# Patient Record
Sex: Male | Born: 1970 | Race: White | Hispanic: No | Marital: Married | Smoking: Never smoker
Health system: Southern US, Community
[De-identification: ages and names within clinical notes are randomized; demographics above are authoritative.]

## PROBLEM LIST (undated history)

## (undated) DIAGNOSIS — E785 Hyperlipidemia, unspecified: Secondary | ICD-10-CM

## (undated) DIAGNOSIS — I2510 Atherosclerotic heart disease of native coronary artery without angina pectoris: Secondary | ICD-10-CM

## (undated) HISTORY — PX: FRACTURE SURGERY: SHX138

## (undated) HISTORY — DX: Atherosclerotic heart disease of native coronary artery without angina pectoris: I25.10

## (undated) HISTORY — DX: Hyperlipidemia, unspecified: E78.5

---

## 1997-11-10 ENCOUNTER — Emergency Department (HOSPITAL_COMMUNITY): Admission: EM | Admit: 1997-11-10 | Discharge: 1997-11-10 | Payer: Self-pay | Admitting: Emergency Medicine

## 1998-09-27 ENCOUNTER — Ambulatory Visit (HOSPITAL_COMMUNITY): Admission: RE | Admit: 1998-09-27 | Discharge: 1998-09-27 | Payer: Self-pay | Admitting: Internal Medicine

## 1999-05-02 ENCOUNTER — Emergency Department (HOSPITAL_COMMUNITY): Admission: EM | Admit: 1999-05-02 | Discharge: 1999-05-02 | Payer: Self-pay | Admitting: Emergency Medicine

## 2000-03-28 ENCOUNTER — Ambulatory Visit (HOSPITAL_COMMUNITY): Admission: RE | Admit: 2000-03-28 | Discharge: 2000-03-28 | Payer: Self-pay | Admitting: Internal Medicine

## 2000-03-28 ENCOUNTER — Encounter: Payer: Self-pay | Admitting: Internal Medicine

## 2002-04-10 ENCOUNTER — Encounter: Payer: Self-pay | Admitting: *Deleted

## 2002-04-10 ENCOUNTER — Emergency Department (HOSPITAL_COMMUNITY): Admission: EM | Admit: 2002-04-10 | Discharge: 2002-04-10 | Payer: Self-pay | Admitting: *Deleted

## 2002-04-19 ENCOUNTER — Emergency Department (HOSPITAL_COMMUNITY): Admission: EM | Admit: 2002-04-19 | Discharge: 2002-04-20 | Payer: Self-pay | Admitting: Emergency Medicine

## 2002-04-20 ENCOUNTER — Encounter: Payer: Self-pay | Admitting: Emergency Medicine

## 2002-04-26 ENCOUNTER — Ambulatory Visit (HOSPITAL_COMMUNITY): Admission: RE | Admit: 2002-04-26 | Discharge: 2002-04-26 | Payer: Self-pay | Admitting: Orthopedic Surgery

## 2002-04-26 ENCOUNTER — Encounter: Payer: Self-pay | Admitting: Orthopedic Surgery

## 2002-05-04 ENCOUNTER — Ambulatory Visit (HOSPITAL_BASED_OUTPATIENT_CLINIC_OR_DEPARTMENT_OTHER): Admission: RE | Admit: 2002-05-04 | Discharge: 2002-05-05 | Payer: Self-pay | Admitting: Orthopedic Surgery

## 2002-05-16 ENCOUNTER — Encounter: Payer: Self-pay | Admitting: Emergency Medicine

## 2002-05-16 ENCOUNTER — Emergency Department (HOSPITAL_COMMUNITY): Admission: EM | Admit: 2002-05-16 | Discharge: 2002-05-16 | Payer: Self-pay | Admitting: Emergency Medicine

## 2002-05-24 ENCOUNTER — Encounter: Admission: RE | Admit: 2002-05-24 | Discharge: 2002-08-22 | Payer: Self-pay | Admitting: Orthopedic Surgery

## 2008-04-28 ENCOUNTER — Emergency Department (HOSPITAL_COMMUNITY): Admission: EM | Admit: 2008-04-28 | Discharge: 2008-04-28 | Payer: Self-pay | Admitting: Emergency Medicine

## 2008-04-30 ENCOUNTER — Emergency Department (HOSPITAL_BASED_OUTPATIENT_CLINIC_OR_DEPARTMENT_OTHER): Admission: EM | Admit: 2008-04-30 | Discharge: 2008-04-30 | Payer: Self-pay | Admitting: Emergency Medicine

## 2008-05-06 ENCOUNTER — Emergency Department (HOSPITAL_COMMUNITY): Admission: EM | Admit: 2008-05-06 | Discharge: 2008-05-06 | Payer: Self-pay | Admitting: Emergency Medicine

## 2008-05-10 ENCOUNTER — Encounter: Admission: RE | Admit: 2008-05-10 | Discharge: 2008-05-10 | Payer: Self-pay | Admitting: Orthopedic Surgery

## 2008-05-11 ENCOUNTER — Encounter: Admission: RE | Admit: 2008-05-11 | Discharge: 2008-05-11 | Payer: Self-pay | Admitting: Orthopedic Surgery

## 2009-07-06 ENCOUNTER — Emergency Department (HOSPITAL_COMMUNITY): Admission: EM | Admit: 2009-07-06 | Discharge: 2009-07-06 | Payer: Self-pay | Admitting: Emergency Medicine

## 2009-07-11 ENCOUNTER — Encounter: Admission: RE | Admit: 2009-07-11 | Discharge: 2009-07-11 | Payer: Self-pay | Admitting: Family Medicine

## 2010-05-06 ENCOUNTER — Encounter: Payer: Self-pay | Admitting: Family Medicine

## 2010-06-08 ENCOUNTER — Other Ambulatory Visit: Payer: Self-pay | Admitting: Family Medicine

## 2010-06-08 DIAGNOSIS — R911 Solitary pulmonary nodule: Secondary | ICD-10-CM

## 2010-06-15 ENCOUNTER — Ambulatory Visit
Admission: RE | Admit: 2010-06-15 | Discharge: 2010-06-15 | Disposition: A | Discharge status: Home / Self Care | Payer: Managed Care, Other (non HMO) | Source: Ambulatory Visit | Attending: Family Medicine | Admitting: Family Medicine

## 2010-06-15 DIAGNOSIS — R911 Solitary pulmonary nodule: Secondary | ICD-10-CM

## 2010-06-15 MED ORDER — IOHEXOL 300 MG/ML  SOLN
75.0000 mL | Freq: Once | INTRAMUSCULAR | Status: AC | PRN
  Administered 2010-06-15: 75 mL via INTRAVENOUS

## 2010-07-09 LAB — CBC
HCT: 37.3 % — ABNORMAL LOW (ref 39.0–52.0)
MCV: 90.7 fL (ref 78.0–100.0)
Platelets: 215 10*3/uL (ref 150–400)
WBC: 6.5 10*3/uL (ref 4.0–10.5)

## 2010-07-09 LAB — COMPREHENSIVE METABOLIC PANEL
ALT: 19 U/L (ref 0–53)
AST: 20 U/L (ref 0–37)
Alkaline Phosphatase: 53 U/L (ref 39–117)
Creatinine, Ser: 1.09 mg/dL (ref 0.4–1.5)
GFR calc Af Amer: >60 mL/min (ref >60)
Total Bilirubin: 0.7 mg/dL (ref 0.3–1.2)
Total Protein: 6.6 g/dL (ref 6.0–8.3)

## 2010-07-09 LAB — POCT CARDIAC MARKERS
CKMB, poc: <1 ng/mL — ABNORMAL LOW (ref 1.0–8.0)
CKMB, poc: <1 ng/mL — ABNORMAL LOW (ref 1.0–8.0)
Troponin i, poc: <0.05 ng/mL (ref 0.00–0.09)
Troponin i, poc: <0.05 ng/mL (ref 0.00–0.09)

## 2010-07-09 LAB — DIFFERENTIAL
Eosinophils Relative: 1 % (ref 0–5)
Monocytes Relative: 9 % (ref 3–12)
Neutro Abs: 4.2 10*3/uL (ref 1.7–7.7)

## 2010-08-31 NOTE — Op Note (Signed)
NAME:  Frank Guzman, Frank Guzman                          ACCOUNT NO.:  [id number]   MEDICAL RECORD NO.:  [id number]                   PATIENT TYPE:  AMB   LOCATION:  DSC                                  FACILITY:  [facility]   PHYSICIAN:  Leonides Grills, M.D.                  DATE OF BIRTH:  1971/01/12   DATE OF PROCEDURE:  05/04/2002  DATE OF DISCHARGE:                                 OPERATIVE REPORT   PREOPERATIVE DIAGNOSIS:  Right closed calcaneus fracture.   POSTOPERATIVE DIAGNOSES:  1. Right closed calcaneus fracture.  2. Peroneus longus tendon tear.   PROCEDURES:  1. Open reduction and internal fixation of right calcaneus fracture.  2. Repair of peroneus longus tendon tear.   ANESTHESIA:  General endotracheal tube.   SURGEON:  Leonides Grills, M.D.   ASSISTANT:  Lianne Cure, P.A.   ESTIMATED BLOOD LOSS:  Minimal.   TOURNIQUET TIME:  Two hours.   COMPLICATIONS:  None.   DISPOSITION:  Stable to the PAR.   INDICATIONS:  This is a 40 year old male who fell 25 feet off a ladder and  sustained an isolated right calcaneus fracture.  After adequate x-rays and  CT scan were obtained and elevation over the last two weeks, he was brought  to the operating room for open reduction and internal fixation.  He was  consented for the above procedure.  All risks, which include infection,  nerve or vessel injury, flap breakdown, arthritis, stiffness, possible  fusion, osteomyelitis, were all explained, and questions were encouraged and  answered.   DESCRIPTION OF PROCEDURE:  The patient was brought to the operating room and  placed in the supine position after adequate general endotracheal tube  anesthesia was administered as well as vancomycin 1 g IV piggyback.  The  patient was then placed in the lateral decubitus position with the operative  side up.  An axillary roll was placed.  All bony prominences were well-  padded.  A table-type configuration was then made using blankets and the  limb was then gravity-exsanguinated and the tourniquet was elevated to 290  mmHg after this was prepped and draped in a sterile manner over a proximally-  placed thigh tourniquet.  An L-shaped extensile exposure to the lateral wall  of the calcaneus was then made.  Dissection was carried down through skin.  The sural nerve was identified in both the proximal and distal aspects of  the wound.  The flap was then elevated off the calcaneus subperiosteally and  two 2 mm K-wires were then placed in the lateral edge of the talus and a  flap was not touched or retracted for the remaining portion of the  procedure.  The peroneus longus tendon was identified and a jagged edge of  the lateral wall was protruding and caused a tear in the longus tendon.  This was then repaired with 6-0 nylon suture in a cannulated  manner.  Once  this was repaired, the lateral wall was trimmed back to prevent any  irritation of the tendon.  We then removed the lateral wall of the  calcaneus, which included a portion of the posterior facet.  Once this was  removed, the remaining portion of posterior facet was evaluated.  There was  a small strip approximately 2 mm that was free and did not have any  significant cancellous bone to repair to.  This was removed from the joint.  The medial wall on CT scan was not displaced and the hindfoot was not in  varus or valgus, it was anatomic, in 5 degrees of valgus.  It did not need a  reduction with a Shands pin.  However, the posterior facet medially was  depressed and needed to be reduced adequately.  With this held reduced, K-  wires were then fired axially and held the posterior facet in the reduced  position.  Once this was done, we then  placed the remaining portion of the  lateral wall with the remaining portion of the posterior facet and held this  reduced against the medial portion of posterior facet.  This was then keyed  in at the angle of Gissane and held again with  K-wires provisionally.  We  then obtained C-arm views to show that this was adequately reduced.  We then  obtained a Y plate, i.e., Letournel plate, cut it, pre-bent it to the  lateral wall of the calcaneus.  Multiple 3.5 mm fully-threaded cortical lag  screws were then placed, which sucked the lateral wall in and reduced the  overall width of the calcaneus.  This was then verified under C-arm guidance  to be anatomically reduced with the adequate length of screws.  All screws  surprisingly had excellent purchase as well.  The wound was copiously  irrigated with normal saline.  We did not need to put any allograft chips in  the area of the calcaneus.  Once the wound was copiously irrigated with  normal saline, the tourniquet was deflated.  There was minimal bleeding and  did not require a drain.  We then closed the deep subcu with 2-0 Vicryl, the  skin was closed with 4-0 nylon Allgower-Danoti technique stitch.  A sterile  dressing was applied.  A modified Jones dressing was applied with the ankle  in neutral dorsiflexion.  The patient was stable to the PAR.                                               Leonides Grills, M.D.    PB/MEDQ  D:  05/04/2002  T:  05/04/2002  Job:  [id number]

## 2017-05-04 ENCOUNTER — Emergency Department (HOSPITAL_COMMUNITY): Payer: 59

## 2017-05-04 ENCOUNTER — Encounter (HOSPITAL_COMMUNITY): Payer: Self-pay | Admitting: *Deleted

## 2017-05-04 ENCOUNTER — Other Ambulatory Visit: Payer: Self-pay

## 2017-05-04 ENCOUNTER — Emergency Department (HOSPITAL_COMMUNITY)
Admission: EM | Admit: 2017-05-04 | Discharge: 2017-05-04 | Disposition: A | Discharge status: Home / Self Care | Payer: 59 | Attending: Emergency Medicine | Admitting: Emergency Medicine

## 2017-05-04 DIAGNOSIS — Y9389 Activity, other specified: Secondary | ICD-10-CM | POA: Diagnosis not present

## 2017-05-04 DIAGNOSIS — Y998 Other external cause status: Secondary | ICD-10-CM | POA: Diagnosis not present

## 2017-05-04 DIAGNOSIS — R42 Dizziness and giddiness: Secondary | ICD-10-CM | POA: Insufficient documentation

## 2017-05-04 DIAGNOSIS — S6992XA Unspecified injury of left wrist, hand and finger(s), initial encounter: Secondary | ICD-10-CM | POA: Diagnosis present

## 2017-05-04 DIAGNOSIS — S62115A Nondisplaced fracture of triquetrum [cuneiform] bone, left wrist, initial encounter for closed fracture: Secondary | ICD-10-CM | POA: Insufficient documentation

## 2017-05-04 DIAGNOSIS — R110 Nausea: Secondary | ICD-10-CM | POA: Insufficient documentation

## 2017-05-04 DIAGNOSIS — Z880 Allergy status to penicillin: Secondary | ICD-10-CM | POA: Insufficient documentation

## 2017-05-04 DIAGNOSIS — Y929 Unspecified place or not applicable: Secondary | ICD-10-CM | POA: Diagnosis not present

## 2017-05-04 DIAGNOSIS — R61 Generalized hyperhidrosis: Secondary | ICD-10-CM | POA: Insufficient documentation

## 2017-05-04 DIAGNOSIS — W010XXA Fall on same level from slipping, tripping and stumbling without subsequent striking against object, initial encounter: Secondary | ICD-10-CM | POA: Diagnosis not present

## 2017-05-04 MED ORDER — OXYCODONE-ACETAMINOPHEN 5-325 MG PO TABS
1.0000 | ORAL_TABLET | ORAL | Status: DC | PRN
  Administered 2017-05-04: 1 via ORAL
  Filled 2017-05-04 (×2): qty 1

## 2017-05-04 MED ORDER — OXYCODONE-ACETAMINOPHEN 5-325 MG PO TABS: 2.0000 | ORAL_TABLET | ORAL | 0 refills | Status: DC | PRN

## 2017-05-04 MED ORDER — ONDANSETRON 4 MG PO TBDP: 4.0000 mg | ORAL_TABLET | Freq: Three times a day (TID) | ORAL | 0 refills | Status: DC | PRN

## 2017-05-04 NOTE — ED Triage Notes (Signed)
Pt is here with left wrist injury after falling back off a Hooverboard.  Ice pack placed to site

## 2017-05-04 NOTE — ED Notes (Signed)
Splint applied. Instructions reviewed. Patient and family good for home.

## 2017-05-04 NOTE — Discharge Instructions (Signed)
Apply ice for 20 minutes 3-4 times per day. Leave splint in place until seen by orthopedics. Call Dr. Eulah PontMurphy for outpatient appointment for reevaluation and casting

## 2017-05-04 NOTE — ED Notes (Signed)
Ortho paged and returned called

## 2017-05-04 NOTE — ED Provider Notes (Signed)
[facility] Provider Note   CSN: [id number] Arrival date & time: 05/04/17  1507     History   Chief Complaint Chief Complaint  Patient presents with  . Wrist Injury    HPI Glenda ChromanWayne C Carmer is a 47 y.o. male.  Chief complaint is wrist injury.  HPI: 47 year old male.  States he was going to show his son how to use a however board.  He has never used a however board before.  He fell backwards onto an outstretched left wrist and felt immediate pain.  He states he grabbed his wrist and tried to move it.  He felt the "bone move".  He became nauseated near syncopal.  He has a history of vagal episodes.  History reviewed. No pertinent past medical history.  There are no active problems to display for this patient.   Past Surgical History:  Procedure Laterality Date  . FRACTURE SURGERY         Home Medications    Prior to Admission medications   Medication Sig Start Date End Date Taking? Authorizing Provider  ondansetron (ZOFRAN ODT) 4 MG disintegrating tablet Take 1 tablet (4 mg total) by mouth every 8 (eight) hours as needed for nausea. 05/04/17   Rolland PorterJames, Eleuterio Dollar, MD  oxyCODONE-acetaminophen (PERCOCET/ROXICET) 5-325 MG tablet Take 2 tablets by mouth every 4 (four) hours as needed. 05/04/17   Rolland PorterJames, Jaelyne Deeg, MD    Family History No family history on file.  Social History Social History   Tobacco Use  . Smoking status: Never Smoker  . Smokeless tobacco: Never Used  Substance Use Topics  . Alcohol use: Yes    Frequency: Never  . Drug use: No     Allergies   Penicillins and Tetanus toxoids   Review of Systems Review of Systems  Constitutional: Positive for diaphoresis. Negative for appetite change, chills, fatigue and fever.  HENT: Negative for mouth sores, sore throat and trouble swallowing.   Eyes: Negative for visual disturbance.  Respiratory: Negative for cough, chest tightness, shortness of breath and wheezing.     Cardiovascular: Negative for chest pain.  Gastrointestinal: Positive for nausea. Negative for abdominal distention, abdominal pain, diarrhea and vomiting.  Endocrine: Negative for polydipsia, polyphagia and polyuria.  Genitourinary: Negative for dysuria, frequency and hematuria.  Musculoskeletal: Negative for gait problem.       Left wrist pain.  Skin: Negative for color change, pallor and rash.  Neurological: Positive for dizziness. Negative for syncope, light-headedness and headaches.  Hematological: Does not bruise/bleed easily.  Psychiatric/Behavioral: Negative for behavioral problems and confusion.     Physical Exam Updated Vital Signs BP (!) 148/83 (BP Location: Right Arm)   Pulse 74   Temp 98.2 F (36.8 C) (Oral)   Resp 18   SpO2 100%   Physical Exam  Constitutional: He is oriented to person, place, and time. He appears well-developed and well-nourished. No distress.  HENT:  Head: Normocephalic.  Eyes: No scleral icterus.  Neck: No thyromegaly present.  Cardiovascular: Normal rate and regular rhythm. Exam reveals no gallop and no friction rub.  No murmur heard. Pulmonary/Chest: Effort normal and breath sounds normal. No respiratory distress. He has no wheezes. He has no rales.  Abdominal: He exhibits no distension. There is no tenderness. There is no rebound.  Musculoskeletal:  Tenderness to the dorsal medial wrist in the area of the triquetrum.  Proximal to the fourth and fifth ray  Neurological: He is alert and oriented to person, place, and time.  Skin: Skin is warm and dry. No rash noted.  Psychiatric: He has a normal mood and affect. His behavior is normal.     ED Treatments / Results  Labs (all labs ordered are listed, but only abnormal results are displayed) Labs Reviewed - No data to display  EKG  EKG Interpretation None       Radiology Dg Wrist Complete Left  Result Date: 05/04/2017 CLINICAL DATA:  Pain after fall EXAM: LEFT WRIST - COMPLETE 3+  VIEW COMPARISON:  None. FINDINGS: There is a fracture fragment identified along the dorsum of the wrist on the lateral view only. No other suspicious findings. A bone cyst is seen in the scaphoid. No scaphoid fractures noted. IMPRESSION: A fracture is identified on the lateral view only. I suspect the triquetrum is involved. Electronically Signed   By: Gerome Sam III M.D   On: 05/04/2017 15:53    Procedures Procedures (including critical care time)  Medications Ordered in ED Medications  oxyCODONE-acetaminophen (PERCOCET/ROXICET) 5-325 MG per tablet 1 tablet (1 tablet Oral Given 05/04/17 1520)     Initial Impression / Assessment and Plan / ED Course  I have reviewed the triage vital signs and the nursing notes.  Pertinent labs & imaging results that were available during my care of the patient were reviewed by me and considered in my medical decision making (see chart for details).     X-rays show a small fragment of bone on the lateral view only likely triquetral fracture.  No other carpal bone abnormalities noted.  Splinted.  Will use ice.  Elevation.  Hand surgical follow-up.  Dr. Eulah Pont with [facility] is on call.  Patient will call for appointment.  Percocet, as needed Zofran.  SPLINT APPLICATION Date/Time: 4:31 PM Authorized by: Claudean Kinds Consent: Verbal consent obtained. Risks and benefits: risks, benefits and alternatives were discussed Consent given by: patient Splint applied by: +orthopedic technician Location details: Lt wrist Splint type: volar wrist Supplies used: Orthoglass Post-procedure: The splinted body part was neurovascularly unchanged following the procedure. Patient tolerance: Patient tolerated the procedure well with no immediate complications.     Final Clinical Impressions(s) / ED Diagnoses   Final diagnoses:  Closed nondisplaced fracture of triquetrum of left wrist, initial encounter    ED Discharge Orders         Ordered    oxyCODONE-acetaminophen (PERCOCET/ROXICET) 5-325 MG tablet  Every 4 hours PRN     05/04/17 1626    ondansetron (ZOFRAN ODT) 4 MG disintegrating tablet  Every 8 hours PRN     05/04/17 1626       Rolland Porter, MD 05/04/17 1631

## 2017-05-04 NOTE — Progress Notes (Signed)
Orthopedic Tech Progress Note Patient Details:  Glenda ChromanWayne C Claunch 1971/03/23 [id number]  Ortho Devices Type of Ortho Device: Ace wrap, Volar splint Ortho Device/Splint Location: LUE Ortho Device/Splint Interventions: Ordered, Application   Post Interventions Patient Tolerated: Well Instructions Provided: Care of device   Jennye MoccasinHughes, Davionna Blacksher Craig 05/04/2017, 4:46 PM

## 2017-11-06 ENCOUNTER — Other Ambulatory Visit: Payer: Self-pay | Admitting: Family Medicine

## 2017-11-06 ENCOUNTER — Ambulatory Visit
Admission: RE | Admit: 2017-11-06 | Discharge: 2017-11-06 | Disposition: A | Discharge status: Home / Self Care | Payer: 59 | Source: Ambulatory Visit | Attending: Family Medicine | Admitting: Family Medicine

## 2017-11-06 DIAGNOSIS — M545 Low back pain, unspecified: Secondary | ICD-10-CM

## 2018-09-08 IMAGING — CR DG LUMBAR SPINE COMPLETE 4+V
5 series · 5 of 5 positions shown · non-contrast
Comparison: MRI 05/11/2008, radiographs 04/28/2008

CLINICAL DATA: Low back and buttock pain

EXAM:
LUMBAR SPINE - COMPLETE 4+ VIEW

[w lumbar spine ap]
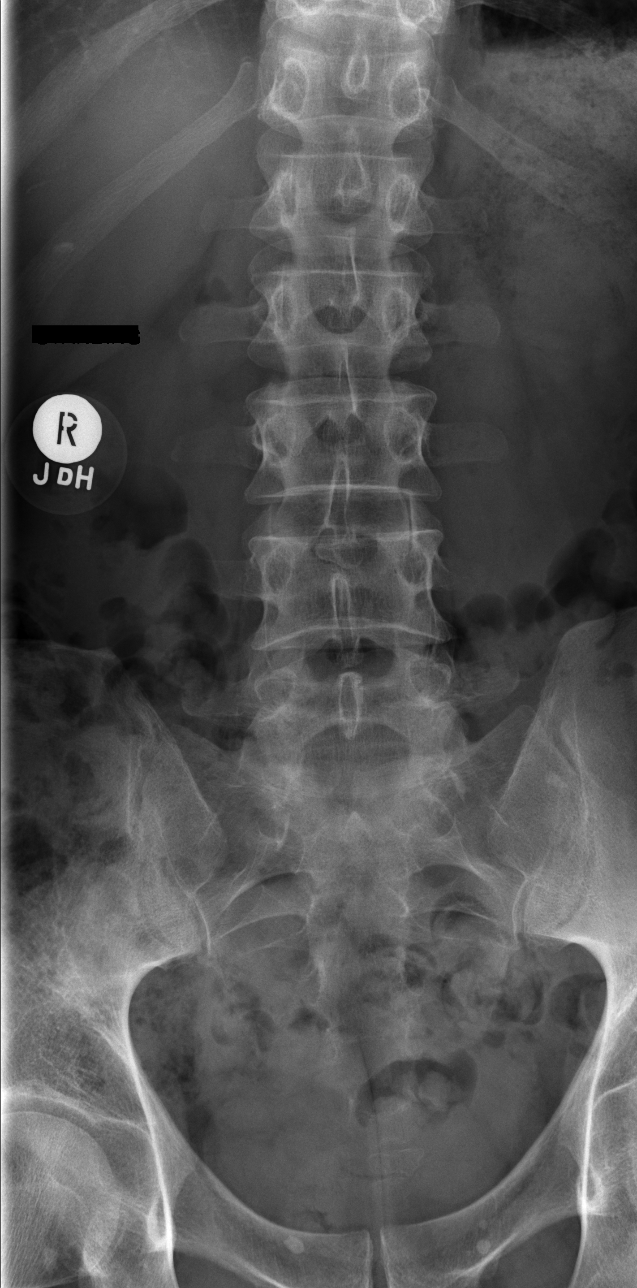

[w lumbar spine obl (1 of 2)]
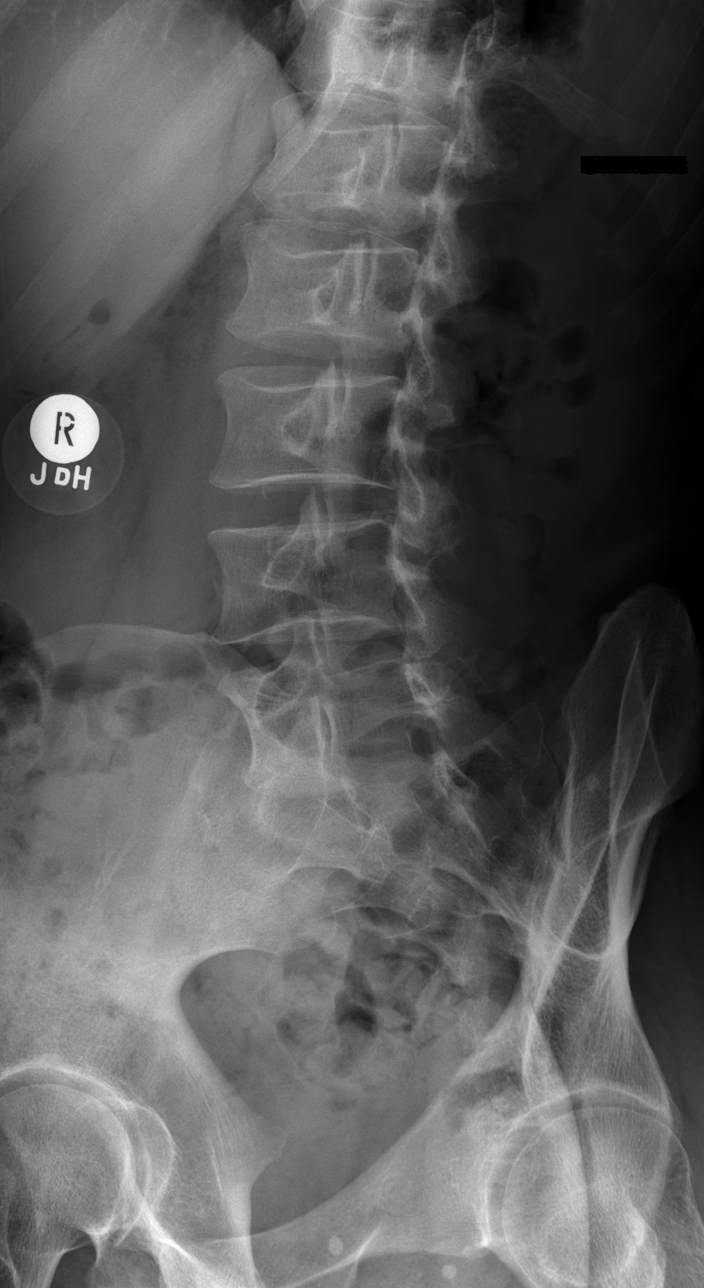

[w lumbar spine obl (2 of 2)]
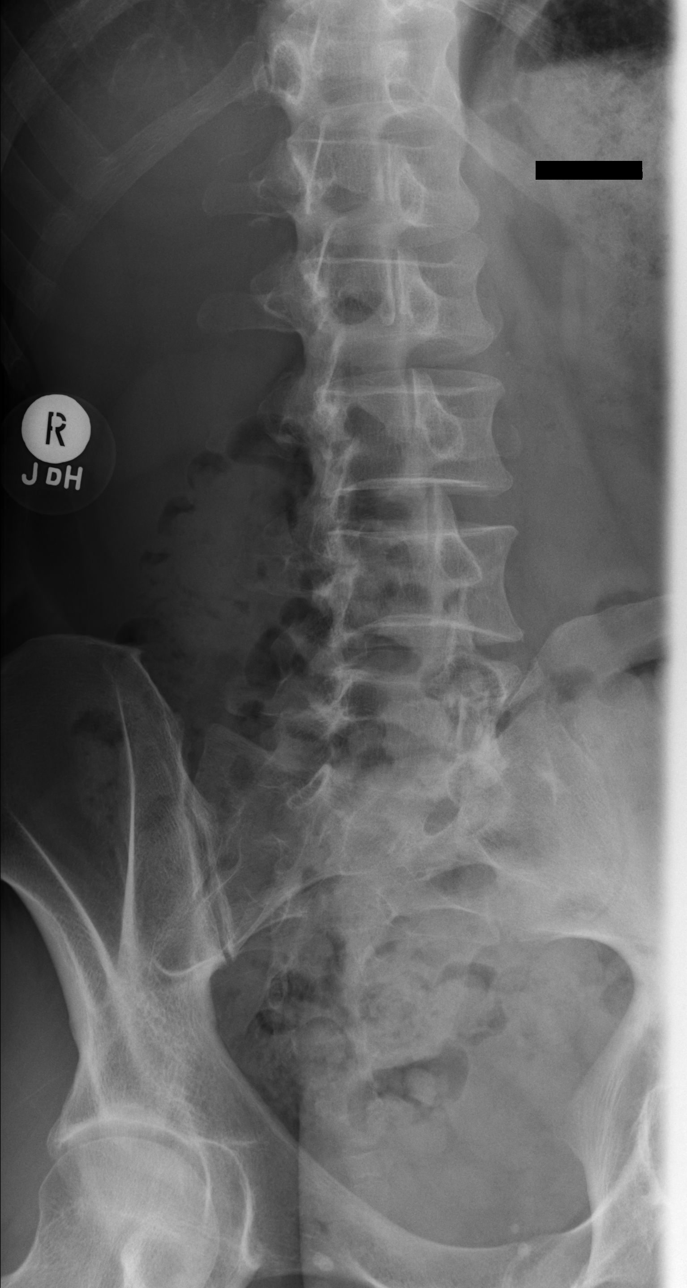

[w lumbar spine lat]
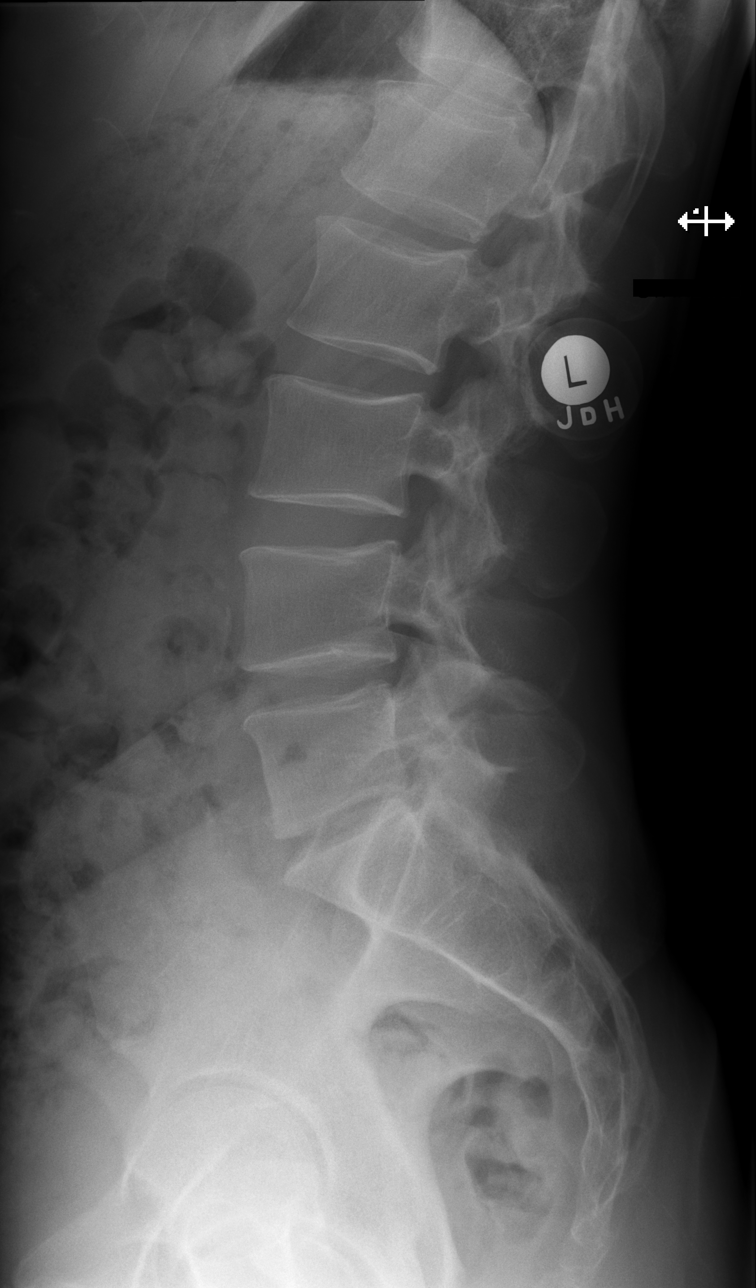

[w lumbar l-5 s-1 spot]
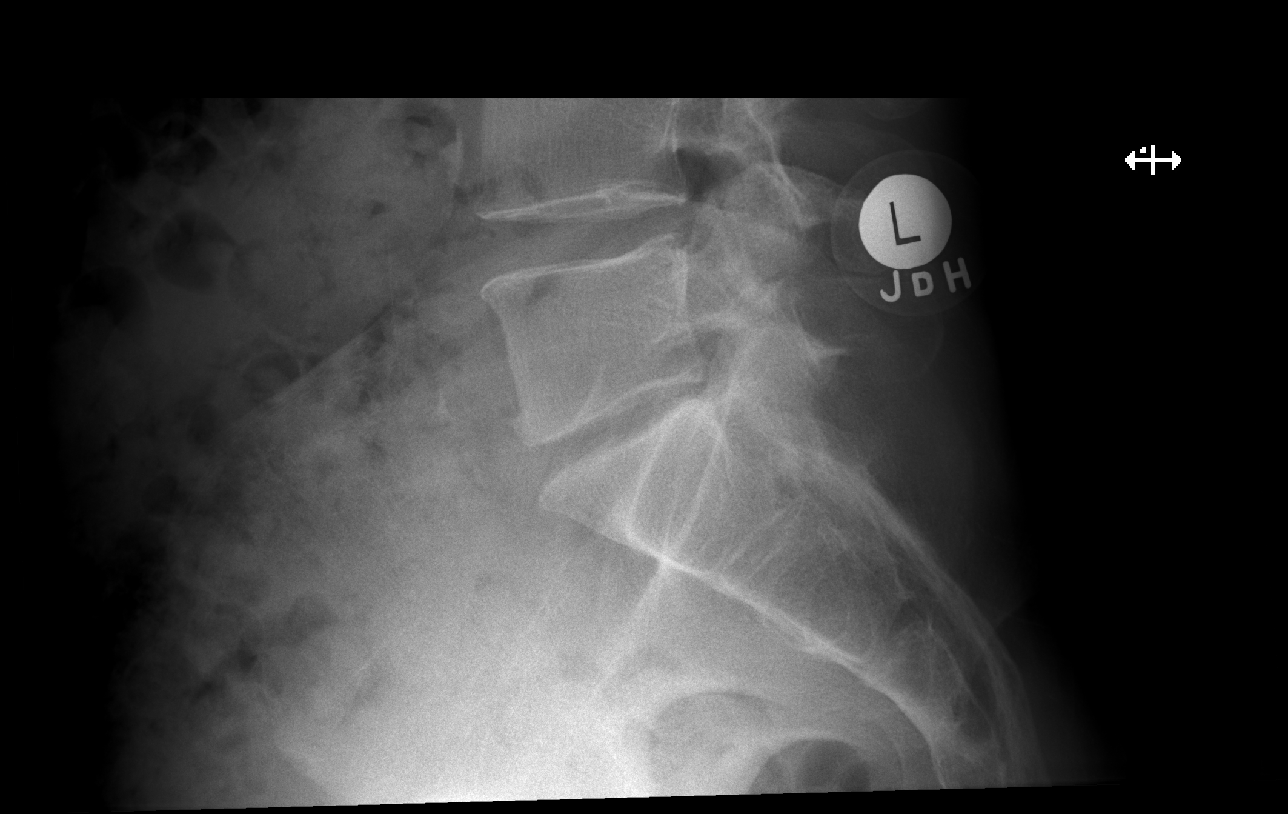

[5 of 5 positions shown; findings below may reference images not displayed]

FINDINGS: Lumbar alignment is within normal limits. Mild disc space narrowing
at L5-S1. The vertebral body heights are normal.
IMPRESSION: Mild degenerative changes at L5-S1.  No acute osseous abnormality.

## 2022-11-11 ENCOUNTER — Ambulatory Visit: Payer: Self-pay | Admitting: Surgery

## 2022-11-11 NOTE — H&P (Signed)
Subjective    Chief Complaint: New Consultation (Gallbladder)   Cardiologist - Dr. Armanda Magic PCP - Dr. Georgann Housekeeper   History of Present Illness: Frank Guzman is a 52 y.o. male who is seen today as an office consultation at the request of Dr. Donette Larry for evaluation of New Consultation (Gallbladder) .   This is a 52 year old male who presents with a 2-year history of intermittent right upper quadrant abdominal pain.  This is associated with some frequent nausea vomiting and bloating.  The pain sometimes radiates into her back and her right shoulder.  The patient denies any diarrhea.  She does occasionally take laxatives for constipation.  She brought this to the attention of her PCP who obtained an ultrasound.  This revealed cholelithiasis with no sign of acute cholecystitis.   Incidentally, she did have a CT scan of the chest abdomen and pelvis in December 2023 for abdominal pain.  This also revealed cholelithiasis without cholecystitis.   The patient had coronary stents placed in 2023.  She is anticoagulated on Coumadin and Plavix. Review of Systems: A complete review of systems was obtained from the patient.  I have reviewed this information and discussed as appropriate with the patient.  See HPI as well for other ROS.   Review of Systems  HENT:  Positive for congestion, hearing loss, nosebleeds and tinnitus.   Gastrointestinal:  Positive for abdominal pain, blood in stool, constipation, nausea and vomiting.  Endo/Heme/Allergies:  Bruises/bleeds easily.  Psychiatric/Behavioral:  Positive for depression.         Medical History: Past Medical History      Past Medical History:  Diagnosis Date   Arthritis     CHF (congestive heart failure) (CMS/HHS-HCC)     Chronic kidney disease     Diabetes mellitus without complication (CMS/HHS-HCC)     History of cancer     Hyperlipidemia     Hypertension     Thyroid disease          Problem List     Patient Active Problem List   Diagnosis   Acute on chronic systolic heart failure (CMS/HHS-HCC)   Acute renal failure superimposed on stage 3b chronic kidney disease (CMS/HHS-HCC)   Aortic stenosis   Atrial fibrillation (CMS/HHS-HCC)   Benign essential HTN   CAD (coronary artery disease)   Diastolic dysfunction   DM (diabetes mellitus), type 2 with peripheral vascular complications (CMS/HHS-HCC)   Hyperlipidemia   Ischemic cardiomyopathy   Hypothyroidism   Gout   Long term current use of anticoagulant   Morbid obesity (CMS/HHS-HCC)   NSVT (nonsustained ventricular tachycardia) (CMS/HHS-HCC)   Polycystic ovary syndrome   Pulmonary HTN (CMS/HHS-HCC)   Stage II breast cancer (CMS/HHS-HCC)        Past Surgical History  History reviewed. No pertinent surgical history.      Allergies  No Known Allergies     Medications Ordered Prior to Encounter        Current Outpatient Medications on File Prior to Visit  Medication Sig Dispense Refill   allopurinoL (ZYLOPRIM) 300 MG tablet Take 300 mg by mouth once daily       atorvastatin (LIPITOR) 80 MG tablet Take 80 mg by mouth once daily       clopidogreL (PLAVIX) 75 mg tablet Take 75 mg by mouth daily with breakfast       ENTRESTO 49-51 mg tablet TAKE ONE TABLET BY MOUTH AT BREAKFAST AND AT BEDTIME       ezetimibe (ZETIA) 10 mg  tablet Take 10 mg by mouth every morning       FUROsemide (LASIX) 20 MG tablet Take by mouth       levothyroxine (SYNTHROID) 137 MCG tablet Take by mouth       metFORMIN (GLUCOPHAGE) 500 MG tablet Take 500 mg by mouth 2 (two) times daily       metoprolol succinate (TOPROL-XL) 50 MG XL tablet Take by mouth       midodrine (PROAMATINE) 5 MG tablet daily at 6 (six) AM.       nitroGLYcerin (NITROSTAT) 0.4 MG SL tablet Place 0.4 mg under the tongue every 5 (five) minutes as needed       pantoprazole (PROTONIX) 20 MG DR tablet Take 20 mg by mouth once daily       spironolactone (ALDACTONE) 25 MG tablet Take 12.5 mg by mouth once daily        warfarin (COUMADIN) 5 MG tablet Take 5 mg by mouth once daily        No current facility-administered medications on file prior to visit.        Family History       Family History  Problem Relation Age of Onset   High blood pressure (Hypertension) Mother     Stroke Father     Diabetes Father     High blood pressure (Hypertension) Father          Tobacco Use History  Social History        Tobacco Use  Smoking Status Former   Types: Cigarettes  Smokeless Tobacco Never        Social History  Social History         Socioeconomic History   Marital status: Married  Tobacco Use   Smoking status: Former      Types: Cigarettes   Smokeless tobacco: Never  Vaping Use   Vaping status: Never Used  Substance and Sexual Activity   Alcohol use: Not Currently   Drug use: Never    Social Determinants of Health        Financial Resource Strain: Low Risk  (03/06/2022)    Received from [facility]    Overall Financial Resource Strain (CARDIA)     Difficulty of Paying Living Expenses: Not very hard  Food Insecurity: No Food Insecurity (03/05/2022)    Received from [facility]    Hunger Vital Sign     Worried About Running Out of Food in the Last Year: Never true     Ran Out of Food in the Last Year: Never true  Transportation Needs: No Transportation Needs (03/05/2022)    Received from [facility] - Transportation     Lack of Transportation (Medical): No     Lack of Transportation (Non-Medical): No        Objective:          Vitals:    11/11/22 0935 11/11/22 0939  BP: 130/82    Temp: 36.4 C (97.6 F)    SpO2: 98%    Weight: 95.3 kg (210 lb 3.2 oz)    Height: 167.6 cm (5\' 6" )    PainSc:   0-No pain    Body mass index is 33.93 kg/m.   Physical Exam    Constitutional:  WDWN in NAD, conversant, no obvious deformities; lying in bed comfortably Eyes:  Pupils equal, round; sclera anicteric; moist conjunctiva; no lid lag HENT:  Oral mucosa moist;  good dentition  Neck:  No masses  palpated, trachea midline; no thyromegaly Lungs:  CTA bilaterally; normal respiratory effort CV:  Regular rate and rhythm; no murmurs; extremities well-perfused with no edema Abd:  +bowel sounds, soft, mild RUQ tenderness; no palpable organomegaly; no palpable hernias Musc:  Unable to assess gait; no apparent clubbing or cyanosis in extremities Lymphatic:  No palpable cervical or axillary lymphadenopathy Skin:  Warm, dry; no sign of jaundice Psychiatric - alert and oriented x 4; calm mood and affect     Labs, Imaging and Diagnostic Testing: CLINICAL DATA:  Right upper quadrant pain for 2 years   EXAM: ULTRASOUND ABDOMEN LIMITED RIGHT UPPER QUADRANT   COMPARISON:  CT abdomen pelvis 03/25/2022   FINDINGS: Gallbladder:   Gallstones: Present   Sludge: Present   Gallbladder Wall: Within normal limits   Pericholecystic fluid: None   Sonographic Murphy's Sign: Negative per technologist   Common bile duct:   Diameter: 4 mm   Liver:   Parenchymal echogenicity: Within normal limits   Contours: Normal   Lesions: None   Portal vein: Patent.  Hepatopetal flow   Other: None.   IMPRESSION: Cholelithiasis without additional sonographic evidence of acute cholecystitis.     Electronically Signed   By: Acquanetta Belling M.D.   On: 10/23/2022 08:56   Assessment and Plan:  Diagnoses and all orders for this visit:   Calculus of gallbladder with chronic cholecystitis without obstruction -     CMP14+eGFR - [facility]     We will obtain blood work today.  We will obtain cardiac clearance from cardiology to hold her anticoagulation before surgery.  Plan laparoscopic cholecystectomy with intraoperative cholangiogram.The surgical procedure has been discussed with the patient.  Potential risks, benefits, alternative treatments, and expected outcomes have been explained.  All of the patient's questions at this time have been answered.  The likelihood of  reaching the patient's treatment goal is good.  The patient understands the proposed surgical procedure and wishes to proceed.         Kristopher Delk Delbert Harness, MD  11/11/2022 11:19 AM

## 2023-02-10 ENCOUNTER — Encounter (HOSPITAL_COMMUNITY): Payer: Self-pay

## 2023-02-10 NOTE — Progress Notes (Signed)
SDW call  Patient was given pre-op instructions over the phone. Patient verbalized understanding of instructions provided.     PCP - Dr. Shaune Pollack,  Cardiologist -  Pulmonary:    PPM/ICD - denies Device Orders - na Rep Notified - na   Chest x-ray - na EKG -  na Stress Test - ECHO -  Cardiac Cath -   Sleep Study/sleep apnea/CPAP: denies  Non-diabetic   Blood Thinner Instructions: denies Aspirin Instructions:denies   ERAS Protcol - Clears until 0430   COVID TEST- na    Anesthesia review: No   Patient denies shortness of breath, fever, cough and chest pain over the phone call  Your procedure is scheduled on Thursday February 13, 2023  Report to [facility] Main Entrance "A" at  0530  A.M., then check in with the Admitting office.  Call this number if you have problems the morning of surgery:  [phone]   If you have any questions prior to your surgery date call [phone]: Open Monday-Friday 8am-4pm If you experience any cold or flu symptoms such as cough, fever, chills, shortness of breath, etc. between now and your scheduled surgery, please notify us at the above number    Remember:  Do not eat after midnight the night before your surgery  You may drink clear liquids until  0430   the morning of your surgery.   Clear liquids allowed are: Water, Non-Citrus Juices (without pulp), Carbonated Beverages, Clear Tea, Black Coffee ONLY (NO MILK, CREAM OR POWDERED CREAMER of any kind), and Gatorade   Take these medicines the morning of surgery with A SIP OF WATER: None  As of today, STOP taking any Aspirin (unless otherwise instructed by your surgeon) Aleve, Naproxen, Ibuprofen, Motrin, Advil, Goody's, BC's, all herbal medications, fish oil, and all vitamins.

## 2023-02-10 NOTE — H&P (Signed)
Subjective    Chief Complaint: New Consultation (Neck cyst)       History of Present Illness: Frank Guzman is a 52 y.o. male who is seen today as an office consultation at the request of Dr. Paulino Rily for evaluation of New Consultation (Neck cyst) .     This is a healthy 52 year old male who presents with a 2-year history of a small cyst on his posterior neck.  Previously he had a small skin tag over this area that he removed.  The cyst recently became quite inflamed and infected.  Some purulent drainage was noted to come from this area.  He was placed on antibiotics by his primary care physician and this area is beginning to resolve.  There is still some mild tenderness but no further drainage.  The patient has a another cyst approximately 5 cm away from the infected cyst.     Medical History: Past Medical History  History reviewed. No pertinent past medical history.      Problem List  There is no problem list on file for this patient.      Past Surgical History       Past Surgical History:  Procedure Laterality Date   heel repaire            Allergies      Allergies  Allergen Reactions   Penicillin Hives        Medications Ordered Prior to Encounter  No current outpatient medications on file prior to visit.    No current facility-administered medications on file prior to visit.        Family History       Family History  Problem Relation Age of Onset   High blood pressure (Hypertension) Mother     Hyperlipidemia (Elevated cholesterol) Mother     Coronary Artery Disease (Blocked arteries around heart) Mother     Breast cancer Mother     High blood pressure (Hypertension) Father     Hyperlipidemia (Elevated cholesterol) Father     Coronary Artery Disease (Blocked arteries around heart) Father          Tobacco Use History  Social History        Tobacco Use  Smoking Status Former   Current packs/day: 0.00   Types: Cigarettes   Quit date: 11/10/2008    Years since quitting: 14.0  Smokeless Tobacco Never        Social History  Social History         Socioeconomic History   Marital status: Divorced  Tobacco Use   Smoking status: Former      Current packs/day: 0.00      Types: Cigarettes      Quit date: 11/10/2008      Years since quitting: 14.0   Smokeless tobacco: Never  Substance and Sexual Activity   Alcohol use: Not Currently   Drug use: Never        Objective:         Vitals:    11/11/22 1058  BP: 120/79  Pulse: 77  Temp: 36.7 C (98 F)  SpO2: 98%  Weight: 86.6 kg (191 lb)  Height: 190.5 cm (6\' 3" )    Body mass index is 23.87 kg/m.   Physical Exam    Constitutional:  WDWN in NAD, conversant, no obvious deformities; lying in bed comfortably Eyes:  Pupils equal, round; sclera anicteric; moist conjunctiva; no lid lag HENT:  Oral mucosa moist; good dentition  Neck:  No masses palpated,  trachea midline; no thyromegaly; his posterior neck just to the left of midline shows a healing 2.5 cm subcutaneous area of firmness.  There is still some very mild erythema.  No opening.  No drainage.  Approximately 4 cm lateral to this area, there is a superficial 1 cm cyst that shows no sign of infection. Lungs:  CTA bilaterally; normal respiratory effort Breasts:  symmetric, no nipple changes; no palpable masses or lymphadenopathy on either side CV:  Regular rate and rhythm; no murmurs; extremities well-perfused with no edema Abd:  +bowel sounds, soft, non-tender, no palpable organomegaly; no palpable hernias Musc: Normal gait; no apparent clubbing or cyanosis in extremities Lymphatic:  No palpable cervical or axillary lymphadenopathy Skin:  Warm, dry; no sign of jaundice Psychiatric - alert and oriented x 4; calm mood and affect       Assessment and Plan:  Diagnoses and all orders for this visit:   Sebaceous cyst Comments: Ruptured, infected - posterior neck     Patient has a recently infected and spontaneously  ruptured sebaceous cyst.  This seems to be improving on antibiotics but he still has a residual cyst.  The patient is at risk for recurrence of the infection.  Recommend excision of this cyst completely to remove the entire cyst wall.  He has another adjacent cyst that currently is not infected.  However would recommend removing this at the same time since it will be in the same operative field.  Recommend performing excision of both of the cyst under anesthesia to provide adequate exposure to ensure complete excision of both of these areas.   The surgical procedure has been discussed with the patient.  Potential risks, benefits, alternative treatments, and expected outcomes have been explained.  All of the patient's questions at this time have been answered.  The likelihood of reaching the patient's treatment goal is good.  The patient understands the proposed surgical procedure and wishes to proceed.   Wilmon Arms. Corliss Skains, MD, [facility]  General Surgery   02/10/2023 8:56 AM

## 2023-02-11 ENCOUNTER — Inpatient Hospital Stay (HOSPITAL_COMMUNITY)
Admission: RE | Admit: 2023-02-11 | Discharge: 2023-02-11 | Disposition: A | Discharge status: Home / Self Care | Payer: 59 | Source: Ambulatory Visit

## 2023-02-12 NOTE — Anesthesia Preprocedure Evaluation (Signed)
Anesthesia Evaluation  Patient identified by MRN, date of birth, ID band Patient awake    Reviewed: Allergy & Precautions, H&P , NPO status , Patient's Chart, lab work & pertinent test results  Airway Mallampati: I  TM Distance: >3 FB Neck ROM: Full    Dental no notable dental hx. (+) Teeth Intact, Dental Advisory Given, Poor Dentition, Chipped,    Pulmonary neg pulmonary ROS   Pulmonary exam normal breath sounds clear to auscultation       Cardiovascular Exercise Tolerance: Good negative cardio ROS Normal cardiovascular exam Rhythm:Regular Rate:Normal     Neuro/Psych negative neurological ROS  negative psych ROS   GI/Hepatic negative GI ROS, Neg liver ROS,,,  Endo/Other  negative endocrine ROS    Renal/GU negative Renal ROS  negative genitourinary   Musculoskeletal negative musculoskeletal ROS (+)    Abdominal   Peds negative pediatric ROS (+)  Hematology negative hematology ROS (+)   Anesthesia Other Findings   Reproductive/Obstetrics negative OB ROS                             Anesthesia Physical Anesthesia Plan  ASA: 1  Anesthesia Plan: General   Post-op Pain Management: Tylenol PO (pre-op)* and Celebrex PO (pre-op)*   Induction: Intravenous  PONV Risk Score and Plan: 2 and Ondansetron, Dexamethasone and Treatment may vary due to age or medical condition  Airway Management Planned: Oral ETT and LMA  Additional Equipment: None  Intra-op Plan:   Post-operative Plan: Extubation in OR  Informed Consent: I have reviewed the patients History and Physical, chart, labs and discussed the procedure including the risks, benefits and alternatives for the proposed anesthesia with the patient or authorized representative who has indicated his/her understanding and acceptance.       Plan Discussed with: Anesthesiologist and CRNA  Anesthesia Plan Comments: (  )        Anesthesia Quick Evaluation

## 2023-02-13 ENCOUNTER — Ambulatory Visit (HOSPITAL_BASED_OUTPATIENT_CLINIC_OR_DEPARTMENT_OTHER): Payer: 59 | Admitting: Anesthesiology

## 2023-02-13 ENCOUNTER — Ambulatory Visit (HOSPITAL_COMMUNITY)
Admission: RE | Admit: 2023-02-13 | Discharge: 2023-02-13 | Disposition: A | Discharge status: Home / Self Care | Payer: 59 | Attending: Surgery | Admitting: Surgery

## 2023-02-13 ENCOUNTER — Ambulatory Visit (HOSPITAL_COMMUNITY): Payer: 59 | Admitting: Anesthesiology

## 2023-02-13 ENCOUNTER — Encounter (HOSPITAL_COMMUNITY): Payer: Self-pay | Admitting: Surgery

## 2023-02-13 ENCOUNTER — Encounter (HOSPITAL_COMMUNITY)
Admission: RE | Disposition: A | Discharge status: Home / Self Care | Payer: Self-pay | Source: Home / Self Care | Attending: Surgery

## 2023-02-13 ENCOUNTER — Other Ambulatory Visit: Payer: Self-pay

## 2023-02-13 DIAGNOSIS — L723 Sebaceous cyst: Secondary | ICD-10-CM | POA: Diagnosis not present

## 2023-02-13 DIAGNOSIS — L720 Epidermal cyst: Secondary | ICD-10-CM | POA: Diagnosis not present

## 2023-02-13 HISTORY — PX: CYST EXCISION: SHX5701

## 2023-02-13 LAB — CBC
HCT: 38.8 % — ABNORMAL LOW (ref 39.0–52.0)
Hemoglobin: 13.1 g/dL (ref 13.0–17.0)
MCH: 30.8 pg (ref 26.0–34.0)
MCHC: 33.8 g/dL (ref 30.0–36.0)
MCV: 91.1 fL (ref 80.0–100.0)
Platelets: 209 10*3/uL (ref 150–400)
RBC: 4.26 MIL/uL (ref 4.22–5.81)
RDW: 12 % (ref 11.5–15.5)
WBC: 3.6 10*3/uL — ABNORMAL LOW (ref 4.0–10.5)
nRBC: 0 % (ref 0.0–0.2)

## 2023-02-13 SURGERY — CYST REMOVAL
Anesthesia: General | Site: Neck

## 2023-02-13 MED ORDER — MIDAZOLAM HCL 2 MG/2ML IJ SOLN
INTRAMUSCULAR | Status: AC
Start: 2023-02-13 — End: ?
  Filled 2023-02-13: qty 2

## 2023-02-13 MED ORDER — LIDOCAINE 2% (20 MG/ML) 5 ML SYRINGE
INTRAMUSCULAR | Status: DC | PRN
  Administered 2023-02-13: 100 mg via INTRAVENOUS

## 2023-02-13 MED ORDER — CELECOXIB 200 MG PO CAPS
200.0000 mg | ORAL_CAPSULE | Freq: Once | ORAL | Status: AC
  Administered 2023-02-13: 200 mg via ORAL
  Filled 2023-02-13: qty 1

## 2023-02-13 MED ORDER — SUCCINYLCHOLINE CHLORIDE 200 MG/10ML IV SOSY
PREFILLED_SYRINGE | INTRAVENOUS | Status: DC | PRN
  Administered 2023-02-13: 160 mg via INTRAVENOUS

## 2023-02-13 MED ORDER — PHENYLEPHRINE HCL-NACL 20-0.9 MG/250ML-% IV SOLN
INTRAVENOUS | Status: AC
  Filled 2023-02-13: qty 500

## 2023-02-13 MED ORDER — SODIUM CHLORIDE 0.9 % IV SOLN: INTRAVENOUS | Status: DC | PRN

## 2023-02-13 MED ORDER — PROPOFOL 10 MG/ML IV BOLUS
INTRAVENOUS | Status: AC
  Filled 2023-02-13: qty 20

## 2023-02-13 MED ORDER — ONDANSETRON HCL 4 MG/2ML IJ SOLN
INTRAMUSCULAR | Status: AC
  Filled 2023-02-13: qty 2

## 2023-02-13 MED ORDER — KETAMINE HCL 10 MG/ML IJ SOLN
INTRAMUSCULAR | Status: DC | PRN
  Administered 2023-02-13: 30 mg via INTRAVENOUS

## 2023-02-13 MED ORDER — EPINEPHRINE 1 MG/10ML IJ SOSY
PREFILLED_SYRINGE | INTRAMUSCULAR | Status: AC
  Filled 2023-02-13: qty 10

## 2023-02-13 MED ORDER — BUPIVACAINE-EPINEPHRINE 0.25% -1:200000 IJ SOLN
INTRAMUSCULAR | Status: DC | PRN
  Administered 2023-02-13: 4 mL

## 2023-02-13 MED ORDER — DEXAMETHASONE SODIUM PHOSPHATE 10 MG/ML IJ SOLN
INTRAMUSCULAR | Status: AC
  Filled 2023-02-13: qty 1

## 2023-02-13 MED ORDER — SUCCINYLCHOLINE CHLORIDE 200 MG/10ML IV SOSY
PREFILLED_SYRINGE | INTRAVENOUS | Status: AC
  Filled 2023-02-13: qty 10

## 2023-02-13 MED ORDER — PROPOFOL 10 MG/ML IV BOLUS
INTRAVENOUS | Status: DC | PRN
  Administered 2023-02-13: 150 mg via INTRAVENOUS

## 2023-02-13 MED ORDER — FENTANYL CITRATE (PF) 250 MCG/5ML IJ SOLN
INTRAMUSCULAR | Status: AC
  Filled 2023-02-13: qty 5

## 2023-02-13 MED ORDER — OXYCODONE HCL 5 MG PO TABS: 5.0000 mg | ORAL_TABLET | Freq: Once | ORAL | Status: DC | PRN

## 2023-02-13 MED ORDER — EPHEDRINE 5 MG/ML INJ
INTRAVENOUS | Status: AC
  Filled 2023-02-13: qty 5

## 2023-02-13 MED ORDER — MIDAZOLAM HCL 2 MG/2ML IJ SOLN
INTRAMUSCULAR | Status: DC | PRN
  Administered 2023-02-13: 2 mg via INTRAVENOUS

## 2023-02-13 MED ORDER — ONDANSETRON HCL 4 MG/2ML IJ SOLN
INTRAMUSCULAR | Status: DC | PRN
  Administered 2023-02-13: 4 mg via INTRAVENOUS

## 2023-02-13 MED ORDER — ONDANSETRON HCL 4 MG/2ML IJ SOLN: 4.0000 mg | Freq: Once | INTRAMUSCULAR | Status: DC | PRN

## 2023-02-13 MED ORDER — DEXAMETHASONE SODIUM PHOSPHATE 10 MG/ML IJ SOLN
INTRAMUSCULAR | Status: DC | PRN
  Administered 2023-02-13: 5 mg via INTRAVENOUS

## 2023-02-13 MED ORDER — OXYCODONE HCL 5 MG/5ML PO SOLN: 5.0000 mg | Freq: Once | ORAL | Status: DC | PRN

## 2023-02-13 MED ORDER — PROPOFOL 1000 MG/100ML IV EMUL
INTRAVENOUS | Status: AC
  Filled 2023-02-13: qty 100

## 2023-02-13 MED ORDER — CEFAZOLIN SODIUM-DEXTROSE 2-4 GM/100ML-% IV SOLN
2.0000 g | INTRAVENOUS | Status: AC
  Administered 2023-02-13: 2 g via INTRAVENOUS
  Filled 2023-02-13: qty 100

## 2023-02-13 MED ORDER — FENTANYL CITRATE (PF) 250 MCG/5ML IJ SOLN
INTRAMUSCULAR | Status: DC | PRN
  Administered 2023-02-13 (×2): 50 ug via INTRAVENOUS

## 2023-02-13 MED ORDER — ACETAMINOPHEN 325 MG PO TABS: 325.0000 mg | ORAL_TABLET | ORAL | Status: DC | PRN

## 2023-02-13 MED ORDER — CHLORHEXIDINE GLUCONATE CLOTH 2 % EX PADS: 6.0000 | MEDICATED_PAD | Freq: Once | CUTANEOUS | Status: DC

## 2023-02-13 MED ORDER — PHENYLEPHRINE 80 MCG/ML (10ML) SYRINGE FOR IV PUSH (FOR BLOOD PRESSURE SUPPORT)
PREFILLED_SYRINGE | INTRAVENOUS | Status: DC | PRN
  Administered 2023-02-13: 80 ug via INTRAVENOUS
  Administered 2023-02-13: 120 ug via INTRAVENOUS
  Administered 2023-02-13 (×3): 80 ug via INTRAVENOUS

## 2023-02-13 MED ORDER — KETAMINE HCL 50 MG/5ML IJ SOSY
PREFILLED_SYRINGE | INTRAMUSCULAR | Status: AC
  Filled 2023-02-13: qty 5

## 2023-02-13 MED ORDER — FENTANYL CITRATE (PF) 100 MCG/2ML IJ SOLN
25.0000 ug | INTRAMUSCULAR | Status: DC | PRN
Start: 2023-02-13 — End: 2023-02-13

## 2023-02-13 MED ORDER — BUPIVACAINE-EPINEPHRINE (PF) 0.25% -1:200000 IJ SOLN
INTRAMUSCULAR | Status: AC
  Filled 2023-02-13: qty 30

## 2023-02-13 MED ORDER — PHENYLEPHRINE 80 MCG/ML (10ML) SYRINGE FOR IV PUSH (FOR BLOOD PRESSURE SUPPORT)
PREFILLED_SYRINGE | INTRAVENOUS | Status: AC
  Filled 2023-02-13: qty 10

## 2023-02-13 MED ORDER — EPHEDRINE SULFATE-NACL 50-0.9 MG/10ML-% IV SOSY
PREFILLED_SYRINGE | INTRAVENOUS | Status: DC | PRN
  Administered 2023-02-13: 10 mg via INTRAVENOUS

## 2023-02-13 MED ORDER — ROCURONIUM BROMIDE 10 MG/ML (PF) SYRINGE
PREFILLED_SYRINGE | INTRAVENOUS | Status: DC | PRN
  Administered 2023-02-13: 10 mg via INTRAVENOUS

## 2023-02-13 MED ORDER — MEPERIDINE HCL 25 MG/ML IJ SOLN: 6.2500 mg | INTRAMUSCULAR | Status: DC | PRN

## 2023-02-13 MED ORDER — SUGAMMADEX SODIUM 200 MG/2ML IV SOLN
INTRAVENOUS | Status: DC | PRN
  Administered 2023-02-13: 200 mg via INTRAVENOUS

## 2023-02-13 MED ORDER — CHLORHEXIDINE GLUCONATE 0.12 % MT SOLN
15.0000 mL | OROMUCOSAL | Status: AC
  Administered 2023-02-13: 15 mL via OROMUCOSAL
  Filled 2023-02-13 (×2): qty 15

## 2023-02-13 MED ORDER — ACETAMINOPHEN 160 MG/5ML PO SOLN: 325.0000 mg | ORAL | Status: DC | PRN

## 2023-02-13 MED ORDER — ACETAMINOPHEN 500 MG PO TABS
1000.0000 mg | ORAL_TABLET | Freq: Once | ORAL | Status: AC
  Filled 2023-02-13: qty 2

## 2023-02-13 MED ORDER — 0.9 % SODIUM CHLORIDE (POUR BTL) OPTIME
TOPICAL | Status: DC | PRN
  Administered 2023-02-13: 1000 mL

## 2023-02-13 MED ORDER — ACETAMINOPHEN 500 MG PO TABS
1000.0000 mg | ORAL_TABLET | ORAL | Status: AC
  Administered 2023-02-13: 1000 mg via ORAL

## 2023-02-13 SURGICAL SUPPLY — 40 items
APL PRP STRL LF DISP 70% ISPRP (MISCELLANEOUS) ×1
APL SKNCLS STERI-STRIP NONHPOA (GAUZE/BANDAGES/DRESSINGS) ×1
BAG COUNTER SPONGE SURGICOUNT (BAG) ×1 IMPLANT
BAG SPNG CNTER NS LX DISP (BAG)
BENZOIN TINCTURE PRP APPL 2/3 (GAUZE/BANDAGES/DRESSINGS) ×1 IMPLANT
BLADE CLIPPER SURG (BLADE) IMPLANT
CHLORAPREP W/TINT 26 (MISCELLANEOUS) IMPLANT
CNTNR URN SCR LID CUP LEK RST (MISCELLANEOUS) IMPLANT
CONT SPEC 4OZ STRL OR WHT (MISCELLANEOUS) ×1
COVER SURGICAL LIGHT HANDLE (MISCELLANEOUS) ×1 IMPLANT
DRAPE LAPAROTOMY 100X72 PEDS (DRAPES) ×1 IMPLANT
DRSG TEGADERM 4X4.75 (GAUZE/BANDAGES/DRESSINGS) IMPLANT
ELECT CAUTERY BLADE 6.4 (BLADE) IMPLANT
ELECT REM PT RETURN 9FT ADLT (ELECTROSURGICAL) ×1
ELECTRODE REM PT RTRN 9FT ADLT (ELECTROSURGICAL) ×1 IMPLANT
GAUZE SPONGE 2X2 STRL 8-PLY (GAUZE/BANDAGES/DRESSINGS) IMPLANT
GAUZE SPONGE 4X4 12PLY STRL (GAUZE/BANDAGES/DRESSINGS) ×1 IMPLANT
GLOVE BIO SURGEON STRL SZ7 (GLOVE) ×1 IMPLANT
GLOVE BIOGEL PI IND STRL 7.5 (GLOVE) ×1 IMPLANT
GOWN STRL REUS W/ TWL LRG LVL3 (GOWN DISPOSABLE) ×2 IMPLANT
GOWN STRL REUS W/TWL LRG LVL3 (GOWN DISPOSABLE) ×2
KIT BASIN OR (CUSTOM PROCEDURE TRAY) ×1 IMPLANT
KIT TURNOVER KIT B (KITS) ×1 IMPLANT
NDL HYPO 25GX1X1/2 BEV (NEEDLE) IMPLANT
NEEDLE HYPO 25GX1X1/2 BEV (NEEDLE) ×1
NS IRRIG 1000ML POUR BTL (IV SOLUTION) ×1 IMPLANT
PACK GENERAL/GYN (CUSTOM PROCEDURE TRAY) ×1 IMPLANT
PAD ARMBOARD 7.5X6 YLW CONV (MISCELLANEOUS) ×2 IMPLANT
SPECIMEN JAR SMALL (MISCELLANEOUS) IMPLANT
SPIKE FLUID TRANSFER (MISCELLANEOUS) IMPLANT
SPONGE T-LAP 4X18 ~~LOC~~+RFID (SPONGE) ×1 IMPLANT
STRIP CLOSURE SKIN 1/2X4 (GAUZE/BANDAGES/DRESSINGS) ×1 IMPLANT
SUT MNCRL AB 4-0 PS2 18 (SUTURE) ×1 IMPLANT
SUT VIC AB 2-0 SH 27 (SUTURE)
SUT VIC AB 2-0 SH 27X BRD (SUTURE) IMPLANT
SUT VIC AB 3-0 SH 27 (SUTURE) ×1
SUT VIC AB 3-0 SH 27XBRD (SUTURE) ×1 IMPLANT
SYR CONTROL 10ML LL (SYRINGE) IMPLANT
TOWEL GREEN STERILE (TOWEL DISPOSABLE) ×1 IMPLANT
TOWEL GREEN STERILE FF (TOWEL DISPOSABLE) ×1 IMPLANT

## 2023-02-13 NOTE — Anesthesia Postprocedure Evaluation (Signed)
Anesthesia Post Note  Patient: Frank Guzman.  Procedure(s) Performed: EXCISION OF SEBACEOUS CYST X2 POSTERIOR NECK (Neck)     Patient location during evaluation: PACU Anesthesia Type: General Level of consciousness: awake and alert Pain management: pain level controlled Vital Signs Assessment: post-procedure vital signs reviewed and stable Respiratory status: spontaneous breathing, nonlabored ventilation, respiratory function stable and patient connected to nasal cannula oxygen Cardiovascular status: blood pressure returned to baseline and stable Postop Assessment: no apparent nausea or vomiting Anesthetic complications: no  No notable events documented.  Last Vitals:  Vitals:   02/13/23 0845 02/13/23 0900  BP: 122/77 120/70  Pulse: 68 62  Resp: 16 17  Temp:  36.5 C  SpO2: 97% 98%    Last Pain:  Vitals:   02/13/23 0900  TempSrc:   PainSc: 0-No pain                 Alexianna Nachreiner

## 2023-02-13 NOTE — Op Note (Signed)
Pre-op diagnosis: Two sebaceous cyst of the posterior neck (1.5 cm and 1 cm) Postop diagnosis: Same Procedure performed: Excision of 2 sebaceous cyst of the posterior neck Surgeon:Ming Mcmannis K Linnae Rasool Anesthesia: General Indications:This is a healthy 52 year old male who presents with a 2-year history of a small cyst on his posterior neck. Previously he had a small skin tag over this area that he removed. The cyst recently became quite inflamed and infected. Some purulent drainage was noted to come from this area. He was placed on antibiotics by his primary care physician and this area is beginning to resolve. There is still some mild tenderness but no further drainage. The patient has a another cyst approximately 5 cm away from the infected cyst.   Description of procedure: The patient is brought to the operating room placed in the supine position on the stretcher.  After an adequate level of general anesthesia was obtained, the patient was moved to a prone position on the operating room table.  His posterior neck was prepped with ChloraPrep and draped sterile fashion.  A timeout was taken to ensure the proper patient and proper procedure.  The patient has a visible punctate opening in the center of a sebaceous cyst just to the left of midline.  There is no current drainage.  There is a palpable 1.5 cm cyst under this area.  Approximately 4 cm lateral to this area there is a 1 cm superficial sebaceous cyst that is palpated.  We anesthetized both of these areas with local anesthetic.  I made an elliptical incision around the larger cyst near the midline.  We excised the entire cyst intact.  Hemostasis was obtained with cautery.  We excised the superficial cyst on the left part of the neck.  Both of these were sent for pathologic examination.  Hemostasis was obtained with cautery.  We closed the smaller wound more lateral with a 4-0 Monocryl suture.  The larger incision was closed with a deep layer of 3-0 Vicryl in  a subcuticular layer 4-0 Monocryl.  Benzoin and Steri-Strips were applied to both incisions.  A sterile dressing was applied.  The patient is moved back to a supine position.  He was extubated and brought to the recovery room in stable condition.  All sponge, instrument, and needle counts are correct.  Wilmon Arms. Corliss Skains, MD, [facility]  General Surgery   02/13/2023 8:21 AM

## 2023-02-13 NOTE — Discharge Instructions (Signed)
[facility] Office Phone Number [phone]  Cyst Excision: POST OP INSTRUCTIONS  Always review your discharge instruction sheet given to you by the facility where your surgery was performed.  IF YOU HAVE DISABILITY OR FAMILY LEAVE FORMS, YOU MUST BRING THEM TO THE OFFICE FOR PROCESSING.  DO NOT GIVE THEM TO YOUR DOCTOR.  You may take acetaminophen (Tylenol) or ibuprofen (Advil) as needed. Take your usually prescribed medications unless otherwise directed You should eat very light the first 24 hours after surgery, such as soup, crackers, pudding, etc.  Resume your normal diet the day after surgery. Most patients will experience some swelling and bruising around the surgical site.  Ice packs will help.  Swelling and bruising can take several days to resolve.  It is common to experience some constipation if taking pain medication after surgery.  Increasing fluid intake and taking a stool softener will usually help or prevent this problem from occurring.  A mild laxative (Milk of Magnesia or Miralax) should be taken according to package directions if there are no bowel movements after 48 hours. You may remove your bandages 48 hours after surgery, and you may shower at that time.  You will have steri-strips (small skin tapes) in place directly over the incision.  These strips should be left on the skin for 7-10 days.   ACTIVITIES:  You may resume regular daily activities (gradually increasing) beginning the next day.   You may have sexual intercourse when it is comfortable. You may drive when you no longer are taking prescription pain medication, you can comfortably wear a seatbelt, and you can safely maneuver your car and apply brakes. RETURN TO WORK:  1-2 weeks You should see your doctor in the office for a follow-up appointment approximately two to three weeks after your surgery.    WHEN TO CALL YOUR DOCTOR: Fever over 101.0 Nausea and/or vomiting. Extreme swelling or  bruising. Continued bleeding from incision. Increased pain, redness, or drainage from the incision.  The clinic staff is available to answer your questions during regular business hours.  Please don't hesitate to call and ask to speak to one of the nurses for clinical concerns.  If you have a medical emergency, go to the nearest emergency room or call 911.  A surgeon from [facility] is always on call at the hospital.  For further questions, please visit [email or web address]

## 2023-02-13 NOTE — Interval H&P Note (Signed)
History and Physical Interval Note:  02/13/2023 7:11 AM  Frank Abts.  has presented today for surgery, with the diagnosis of RUPTURED INFECTED SEBACEOUS CYST POSTERIOR NECK.  The various methods of treatment have been discussed with the patient and family. After consideration of risks, benefits and other options for treatment, the patient has consented to  Procedure(s): EXCISION OF SEBACEOUS CYST X2 POSTERIOR NECK (N/A) as a surgical intervention.  The patient's history has been reviewed, patient examined, no change in status, stable for surgery.  I have reviewed the patient's chart and labs.  Questions were answered to the patient's satisfaction.     Frank Guzman

## 2023-02-13 NOTE — Plan of Care (Signed)
 [facility] Tonsillectomy/Adenoidectomy, Postoperative PEDS care plan entered in error.

## 2023-02-13 NOTE — Anesthesia Procedure Notes (Addendum)
Procedure Name: Intubation Date/Time: 02/13/2023 7:35 AM  Performed by: Heron Sabins, CRNAPre-anesthesia Checklist: Patient identified, Emergency Drugs available, Suction available and Patient being monitored Patient Re-evaluated:Patient Re-evaluated prior to induction Oxygen Delivery Method: Circle System Utilized Preoxygenation: Pre-oxygenation with 100% oxygen Induction Type: IV induction Ventilation: Mask ventilation without difficulty Laryngoscope Size: Mac and 3 Grade View: Grade I Tube type: Oral Number of attempts: 1 Airway Equipment and Method: Stylet and Oral airway Placement Confirmation: ETT inserted through vocal cords under direct vision, positive ETCO2 and breath sounds checked- equal and bilateral Tube secured with: Tape Dental Injury: Teeth and Oropharynx as per pre-operative assessment

## 2023-02-13 NOTE — Transfer of Care (Signed)
Immediate Anesthesia Transfer of Care Note  Patient: Frank Guzman.  Procedure(s) Performed: EXCISION OF SEBACEOUS CYST X2 POSTERIOR NECK (Neck)  Patient Location: PACU  Anesthesia Type:General  Level of Consciousness: awake, alert , and oriented  Airway & Oxygen Therapy: Patient Spontanous Breathing and Patient connected to face mask oxygen  Post-op Assessment: Report given to RN and Post -op Vital signs reviewed and stable  Post vital signs: stable  Last Vitals:  Vitals Value Taken Time  BP 132/71 02/13/23 0830  Temp 36.5 C 02/13/23 0830  Pulse 71 02/13/23 0831  Resp 21 02/13/23 0831  SpO2 98 % 02/13/23 0831  Vitals shown include unfiled device data.  Last Pain:  Vitals:   02/13/23 0830  TempSrc:   PainSc: 0-No pain      Patients Stated Pain Goal: 0 (02/13/23 0601)  Complications: No notable events documented.

## 2023-02-14 ENCOUNTER — Encounter (HOSPITAL_COMMUNITY): Payer: Self-pay | Admitting: Surgery

## 2023-02-14 LAB — SURGICAL PATHOLOGY

## 2024-01-15 ENCOUNTER — Other Ambulatory Visit (HOSPITAL_BASED_OUTPATIENT_CLINIC_OR_DEPARTMENT_OTHER): Payer: Self-pay | Admitting: Family Medicine

## 2024-01-15 DIAGNOSIS — E7800 Pure hypercholesterolemia, unspecified: Secondary | ICD-10-CM

## 2024-02-10 ENCOUNTER — Ambulatory Visit (HOSPITAL_BASED_OUTPATIENT_CLINIC_OR_DEPARTMENT_OTHER)
Admission: RE | Admit: 2024-02-10 | Discharge: 2024-02-10 | Disposition: A | Discharge status: Home / Self Care | Payer: Self-pay | Source: Ambulatory Visit | Attending: Family Medicine | Admitting: Family Medicine

## 2024-02-10 DIAGNOSIS — E7800 Pure hypercholesterolemia, unspecified: Secondary | ICD-10-CM | POA: Insufficient documentation

## 2024-03-22 NOTE — Progress Notes (Unsigned)
 No chief complaint on file.  History of Present Illness: 53 yo male with history of CAD (abnormal coronary calcium score) who is here today as a new patient to discuss the abnormal calcium score. CT calcium score 523 October 2025. He tells me today that he ***  Primary Care Physician: Delice Race, MD (Inactive)   No past medical history on file.  Past Surgical History:  Procedure Laterality Date   CYST EXCISION N/A 02/13/2023   Procedure: EXCISION OF SEBACEOUS CYST X2 POSTERIOR NECK;  Surgeon: Belinda Cough, MD;  Location: [facility] OR;  Service: General;  Laterality: N/A;   FRACTURE SURGERY      Current Outpatient Medications  Medication Sig Dispense Refill   ondansetron  (ZOFRAN  ODT) 4 MG disintegrating tablet Take 1 tablet (4 mg total) by mouth every 8 (eight) hours as needed for nausea. (Patient not taking: Reported on 02/04/2023) 6 tablet 0   oxyCODONE -acetaminophen  (PERCOCET/ROXICET) 5-325 MG tablet Take 2 tablets by mouth every 4 (four) hours as needed. (Patient not taking: Reported on 02/04/2023) 10 tablet 0   No current facility-administered medications for this visit.    Allergies  Allergen Reactions   Penicillins Hives   Tetanus Toxoid-Containing Vaccines     Social History   Socioeconomic History   Marital status: Divorced    Spouse name: Not on file   Number of children: Not on file   Years of education: Not on file   Highest education level: Not on file  Occupational History   Not on file  Tobacco Use   Smoking status: Never   Smokeless tobacco: Never  Vaping Use   Vaping status: Never Used  Substance and Sexual Activity   Alcohol use: Not Currently   Drug use: No   Sexual activity: Not on file  Other Topics Concern   Not on file  Social History Narrative   Not on file   Social Drivers of Health   Financial Resource Strain: Not on file  Food Insecurity: Not on file  Transportation Needs: Not on file  Physical Activity: Not on file  Stress: Not  on file  Social Connections: Not on file  Intimate Partner Violence: Not on file    No family history on file.  Review of Systems:  As stated in the HPI and otherwise negative.   There were no vitals taken for this visit.  Physical Examination: General: Well developed, well nourished, NAD  HEENT: OP clear, mucus membranes moist  SKIN: warm, dry. No rashes. Neuro: No focal deficits  Musculoskeletal: Muscle strength 5/5 all ext  Psychiatric: Mood and affect normal  Neck: No JVD, no carotid bruits, no thyromegaly, no lymphadenopathy.  Lungs:Clear bilaterally, no wheezes, rhonci, crackles Cardiovascular: Regular rate and rhythm. No murmurs, gallops or rubs. Abdomen:Soft. Bowel sounds present. Non-tender.  Extremities: No lower extremity edema. Pulses are 2 + in the bilateral DP/PT.  EKG:  EKG {ACTION; IS/IS [id number]:[id number]} ordered today. The ekg ordered today demonstrates ***  Recent Labs: No results found for requested labs within last 365 days.   Lipid Panel No results found for: CHOL, TRIG, HDL, CHOLHDL, VLDL, LDLCALC, LDLDIRECT   Wt Readings from Last 3 Encounters:  02/13/23 190 lb (86.2 kg)    Assessment and Plan:   1. CAD without angina: ***  Labs/ tests ordered today include:  No orders of the defined types were placed in this encounter.  Disposition:   F/U with me in ***   Signed, Lonni Cash, MD, FACC 03/22/2024 10:46  AM    [facility] [address] [place], [place], [place]  [address] Phone: [phone]; Fax: [phone]

## 2024-03-23 ENCOUNTER — Ambulatory Visit: Attending: Cardiovascular Disease | Admitting: Cardiovascular Disease

## 2024-03-23 ENCOUNTER — Encounter: Payer: Self-pay | Admitting: Cardiovascular Disease

## 2024-03-23 ENCOUNTER — Other Ambulatory Visit (HOSPITAL_COMMUNITY): Payer: Self-pay

## 2024-03-23 VITALS — BP 101/67 | HR 75 | Ht 75.0 in | Wt 184.0 lb

## 2024-03-23 DIAGNOSIS — I25118 Atherosclerotic heart disease of native coronary artery with other forms of angina pectoris: Secondary | ICD-10-CM

## 2024-03-23 DIAGNOSIS — R931 Abnormal findings on diagnostic imaging of heart and coronary circulation: Secondary | ICD-10-CM

## 2024-03-23 MED ORDER — METOPROLOL TARTRATE 50 MG PO TABS
ORAL_TABLET | ORAL | 0 refills | Status: AC
  Filled 2024-03-23: qty 1, 1d supply, fill #0

## 2024-03-23 NOTE — Patient Instructions (Addendum)
 Medication Instructions:  Your physician recommends that you continue on your current medications as directed. Please refer to the Current Medication list given to you today.  *If you need a refill on your cardiac medications before your next appointment, please call your pharmacy*  Lab Work: None--We will contact Margarete to request labs from October If you have labs (blood work) drawn today and your tests are completely normal, you will receive your results only by: MyChart Message (if you have MyChart) OR A paper copy in the mail If you have any lab test that is abnormal or we need to change your treatment, we will call you to review the results.  Testing/Procedures: Your physician has requested that you have cardiac CT. Cardiac computed tomography (CT) is a painless test that uses an x-ray machine to take clear, detailed pictures of your heart. For further information please visit [email or web address] Please follow instruction sheet as given.    Follow-Up: At [facility], you and your health needs are our priority.  As part of our continuing mission to provide you with exceptional heart care, our providers are all part of one team.  This team includes your primary Cardiologist (physician) and Advanced Practice Providers or APPs (Physician Assistants and Nurse Practitioners) who all work together to provide you with the care you need, when you need it.  Your next appointment:   12 month(s)  Provider:   Lonni Cash, MD    We recommend signing up for the patient portal called MyChart.  Sign up information is provided on this After Visit Summary.  MyChart is used to connect with patients for Virtual Visits (Telemedicine).  Patients are able to view lab/test results, encounter notes, upcoming appointments, etc.  Non-urgent messages can be sent to your provider as well.   To learn more about what you can do with MyChart, go to [email or web address].   Other  Instructions   Your cardiac CT will be scheduled at one of the below locations:   [facility] [address] [place], [place] [address] [phone] (Severe contrast allergies only)  OR   [facility] [address] [place], [place] [address] [phone]  OR   [facility] [address] [place], [place] [address] [phone]  OR   [facility] [address]  [place], [place] [address]  OR   [facility] [address] [place], [place] [phone]  If scheduled at [facility], please arrive at the [facility] and Children's Entrance (Entrance C2) of [facility] 30 minutes prior to test start time. You can use the FREE valet parking offered at entrance C (encouraged to control the heart rate for the test)  Proceed to the [facility] Radiology Department (first floor) to check-in and test prep.  All radiology patients and guests should use entrance C2 at [facility], accessed from [facility], even though the hospital's physical address listed is [address]  If scheduled at the Heart and Vascular Tower at [address], please enter the parking lot using the [address] entrance and use the FREE valet service at the patient drop-off area. Enter the building and check-in with registration on the main floor.  If scheduled at [facility], please arrive to the Heart and Vascular Center 15 mins early for check-in and test prep.  There is spacious parking and easy access to the radiology department from the [facility]  Heart and Vascular entrance. Please enter here and check-in with the desk attendant.   If scheduled at [facility], please arrive 30 minutes early for check-in and test prep.  Please follow these instructions carefully (unless otherwise directed):  An IV will be required for this test and  Nitroglycerin will be given.  Hold all erectile dysfunction medications at least 3 days (72 hrs) prior to test. (Ie viagra, cialis, sildenafil, tadalafil, etc)   On the Night Before the Test: Be sure to Drink plenty of water. Do not consume any caffeinated/decaffeinated beverages or chocolate 12 hours prior to your test. Do not take any antihistamines 12 hours prior to your test.    On the Day of the Test: Drink plenty of water until 1 hour prior to the test. Do not eat any food 1 hour prior to test. You may take your regular medications prior to the test.  Take metoprolol  (Lopressor ) two hours prior to test. If you take Furosemide/Hydrochlorothiazide/Spironolactone/Chlorthalidone, please HOLD on the morning of the test. Patients who wear a continuous glucose monitor MUST remove the device prior to scanning.          After the Test: Drink plenty of water. After receiving IV contrast, you may experience a mild flushed feeling. This is normal. On occasion, you may experience a mild rash up to 24 hours after the test. This is not dangerous. If this occurs, you can take Benadryl 25 mg, Zyrtec, Claritin, or Allegra and increase your fluid intake. (Patients taking Tikosyn should avoid Benadryl, and may take Zyrtec, Claritin, or Allegra) If you experience trouble breathing, this can be serious. If it is severe call 911 IMMEDIATELY. If it is mild, please call our office.  We will call to schedule your test 2-4 weeks out understanding that some insurance companies will need an authorization prior to the service being performed.   For more information and frequently asked questions, please visit our website : [email or web address]  For non-scheduling related questions, please contact the cardiac imaging nurse navigator should you have any questions/concerns: Cardiac Imaging Nurse Navigators Direct Office Dial: [phone]   For scheduling needs, including cancellations and  rescheduling, please call Brittany, [phone].

## 2024-03-26 ENCOUNTER — Encounter (HOSPITAL_COMMUNITY): Payer: Self-pay

## 2024-03-30 ENCOUNTER — Ambulatory Visit (HOSPITAL_COMMUNITY)
Admission: RE | Admit: 2024-03-30 | Discharge: 2024-03-30 | Disposition: A | Source: Ambulatory Visit | Attending: Cardiovascular Disease | Admitting: Cardiovascular Disease

## 2024-03-30 DIAGNOSIS — R931 Abnormal findings on diagnostic imaging of heart and coronary circulation: Secondary | ICD-10-CM | POA: Insufficient documentation

## 2024-03-30 DIAGNOSIS — I25118 Atherosclerotic heart disease of native coronary artery with other forms of angina pectoris: Secondary | ICD-10-CM | POA: Diagnosis present

## 2024-03-30 MED ORDER — NITROGLYCERIN 0.4 MG SL SUBL
0.8000 mg | SUBLINGUAL_TABLET | Freq: Once | SUBLINGUAL | Status: AC
  Administered 2024-03-30: 16:00:00 0.8 mg via SUBLINGUAL

## 2024-03-30 MED ORDER — IOHEXOL 350 MG/ML SOLN
100.0000 mL | Freq: Once | INTRAVENOUS | Status: AC | PRN
  Administered 2024-03-30: 16:00:00 100 mL via INTRAVENOUS

## 2024-04-01 ENCOUNTER — Ambulatory Visit: Payer: Self-pay | Admitting: Cardiovascular Disease
